# Patient Record
Sex: Male | Born: 1960
Health system: Southern US, Community
[De-identification: ages and names within clinical notes are randomized; demographics above are authoritative.]

## PROBLEM LIST (undated history)

## (undated) DIAGNOSIS — I10 Essential (primary) hypertension: Secondary | ICD-10-CM

## (undated) DIAGNOSIS — R0789 Other chest pain: Secondary | ICD-10-CM

## (undated) DIAGNOSIS — E785 Hyperlipidemia, unspecified: Secondary | ICD-10-CM

## (undated) HISTORY — DX: Hyperlipidemia, unspecified: E78.5

## (undated) HISTORY — DX: Other chest pain: R07.89

## (undated) HISTORY — DX: Essential (primary) hypertension: I10

---

## 2003-02-11 ENCOUNTER — Encounter: Payer: Self-pay | Admitting: Emergency Medicine

## 2003-02-11 ENCOUNTER — Emergency Department (HOSPITAL_COMMUNITY): Admission: EM | Admit: 2003-02-11 | Discharge: 2003-02-11 | Payer: Self-pay | Admitting: Emergency Medicine

## 2011-03-06 ENCOUNTER — Other Ambulatory Visit: Payer: Self-pay | Admitting: Family Medicine

## 2011-03-06 DIAGNOSIS — M542 Cervicalgia: Secondary | ICD-10-CM

## 2011-03-08 ENCOUNTER — Other Ambulatory Visit: Payer: Self-pay

## 2011-03-09 ENCOUNTER — Ambulatory Visit (HOSPITAL_COMMUNITY)
Admission: RE | Admit: 2011-03-09 | Discharge: 2011-03-09 | Disposition: A | Discharge status: Home / Self Care | Payer: 59 | Source: Ambulatory Visit | Attending: Family Medicine | Admitting: Family Medicine

## 2011-03-09 DIAGNOSIS — M5030 Other cervical disc degeneration, unspecified cervical region: Secondary | ICD-10-CM | POA: Insufficient documentation

## 2011-03-09 DIAGNOSIS — M47812 Spondylosis without myelopathy or radiculopathy, cervical region: Secondary | ICD-10-CM | POA: Insufficient documentation

## 2011-03-09 DIAGNOSIS — M542 Cervicalgia: Secondary | ICD-10-CM

## 2011-03-09 DIAGNOSIS — M25519 Pain in unspecified shoulder: Secondary | ICD-10-CM | POA: Insufficient documentation

## 2011-03-09 DIAGNOSIS — E041 Nontoxic single thyroid nodule: Secondary | ICD-10-CM | POA: Insufficient documentation

## 2011-03-09 DIAGNOSIS — M79609 Pain in unspecified limb: Secondary | ICD-10-CM | POA: Insufficient documentation

## 2011-03-14 ENCOUNTER — Other Ambulatory Visit: Payer: Self-pay | Admitting: Family Medicine

## 2011-03-14 DIAGNOSIS — E041 Nontoxic single thyroid nodule: Secondary | ICD-10-CM

## 2011-03-16 ENCOUNTER — Ambulatory Visit
Admission: RE | Admit: 2011-03-16 | Discharge: 2011-03-16 | Disposition: A | Discharge status: Home / Self Care | Payer: 59 | Source: Ambulatory Visit | Attending: Family Medicine | Admitting: Family Medicine

## 2011-03-16 DIAGNOSIS — E041 Nontoxic single thyroid nodule: Secondary | ICD-10-CM

## 2011-03-17 ENCOUNTER — Other Ambulatory Visit: Payer: Self-pay | Admitting: Family Medicine

## 2011-03-17 DIAGNOSIS — E042 Nontoxic multinodular goiter: Secondary | ICD-10-CM

## 2011-03-23 ENCOUNTER — Ambulatory Visit
Admission: RE | Admit: 2011-03-23 | Discharge: 2011-03-23 | Disposition: A | Discharge status: Home / Self Care | Payer: 59 | Source: Ambulatory Visit | Attending: Family Medicine | Admitting: Family Medicine

## 2011-03-23 ENCOUNTER — Other Ambulatory Visit (HOSPITAL_COMMUNITY)
Admission: RE | Admit: 2011-03-23 | Discharge: 2011-03-23 | Disposition: A | Discharge status: Home / Self Care | Payer: 59 | Source: Ambulatory Visit | Attending: Interventional Radiology | Admitting: Interventional Radiology

## 2011-03-23 DIAGNOSIS — E049 Nontoxic goiter, unspecified: Secondary | ICD-10-CM | POA: Insufficient documentation

## 2011-03-23 DIAGNOSIS — E042 Nontoxic multinodular goiter: Secondary | ICD-10-CM

## 2013-01-07 ENCOUNTER — Ambulatory Visit
Admission: RE | Admit: 2013-01-07 | Discharge: 2013-01-07 | Disposition: A | Discharge status: Home / Self Care | Payer: 59 | Source: Ambulatory Visit | Attending: Family Medicine | Admitting: Family Medicine

## 2013-01-07 ENCOUNTER — Other Ambulatory Visit: Payer: Self-pay | Admitting: Family Medicine

## 2013-01-07 DIAGNOSIS — R1031 Right lower quadrant pain: Secondary | ICD-10-CM

## 2013-01-07 MED ORDER — IOHEXOL 300 MG/ML  SOLN
125.0000 mL | Freq: Once | INTRAMUSCULAR | Status: AC | PRN
  Administered 2013-01-07: 125 mL via INTRAVENOUS

## 2013-03-04 ENCOUNTER — Other Ambulatory Visit: Payer: Self-pay | Admitting: Gastroenterology

## 2013-08-03 ENCOUNTER — Encounter: Payer: Self-pay | Admitting: *Deleted

## 2013-08-03 DIAGNOSIS — E7800 Pure hypercholesterolemia, unspecified: Secondary | ICD-10-CM | POA: Insufficient documentation

## 2013-08-03 DIAGNOSIS — I10 Essential (primary) hypertension: Secondary | ICD-10-CM

## 2014-01-23 ENCOUNTER — Other Ambulatory Visit: Payer: Self-pay | Admitting: Orthopaedic Surgery

## 2014-01-23 DIAGNOSIS — M25561 Pain in right knee: Secondary | ICD-10-CM

## 2014-01-30 ENCOUNTER — Ambulatory Visit
Admission: RE | Admit: 2014-01-30 | Discharge: 2014-01-30 | Disposition: A | Discharge status: Home / Self Care | Payer: 59 | Source: Ambulatory Visit | Attending: Orthopaedic Surgery | Admitting: Orthopaedic Surgery

## 2014-01-30 DIAGNOSIS — M25561 Pain in right knee: Secondary | ICD-10-CM

## 2016-06-22 DIAGNOSIS — E7800 Pure hypercholesterolemia, unspecified: Secondary | ICD-10-CM | POA: Diagnosis not present

## 2016-10-18 DIAGNOSIS — H401131 Primary open-angle glaucoma, bilateral, mild stage: Secondary | ICD-10-CM | POA: Diagnosis not present

## 2017-01-02 DIAGNOSIS — Z0000 Encounter for general adult medical examination without abnormal findings: Secondary | ICD-10-CM | POA: Diagnosis not present

## 2017-01-02 DIAGNOSIS — I10 Essential (primary) hypertension: Secondary | ICD-10-CM | POA: Diagnosis not present

## 2017-01-02 DIAGNOSIS — Z1159 Encounter for screening for other viral diseases: Secondary | ICD-10-CM | POA: Diagnosis not present

## 2017-01-02 DIAGNOSIS — E7800 Pure hypercholesterolemia, unspecified: Secondary | ICD-10-CM | POA: Diagnosis not present

## 2017-07-10 DIAGNOSIS — H401131 Primary open-angle glaucoma, bilateral, mild stage: Secondary | ICD-10-CM | POA: Diagnosis not present

## 2017-07-18 DIAGNOSIS — H401131 Primary open-angle glaucoma, bilateral, mild stage: Secondary | ICD-10-CM | POA: Diagnosis not present

## 2018-01-08 DIAGNOSIS — E7800 Pure hypercholesterolemia, unspecified: Secondary | ICD-10-CM | POA: Diagnosis not present

## 2018-01-08 DIAGNOSIS — R1310 Dysphagia, unspecified: Secondary | ICD-10-CM | POA: Diagnosis not present

## 2018-01-08 DIAGNOSIS — Z0000 Encounter for general adult medical examination without abnormal findings: Secondary | ICD-10-CM | POA: Diagnosis not present

## 2018-01-08 DIAGNOSIS — I10 Essential (primary) hypertension: Secondary | ICD-10-CM | POA: Diagnosis not present

## 2018-01-10 ENCOUNTER — Other Ambulatory Visit: Payer: Self-pay | Admitting: Family Medicine

## 2018-01-10 DIAGNOSIS — R1310 Dysphagia, unspecified: Secondary | ICD-10-CM

## 2018-01-17 ENCOUNTER — Ambulatory Visit
Admission: RE | Admit: 2018-01-17 | Discharge: 2018-01-17 | Disposition: A | Discharge status: Home / Self Care | Payer: 59 | Source: Ambulatory Visit | Attending: Family Medicine | Admitting: Family Medicine

## 2018-01-17 DIAGNOSIS — R1310 Dysphagia, unspecified: Secondary | ICD-10-CM | POA: Diagnosis not present

## 2018-01-17 DIAGNOSIS — K449 Diaphragmatic hernia without obstruction or gangrene: Secondary | ICD-10-CM | POA: Diagnosis not present

## 2018-01-24 DIAGNOSIS — R6882 Decreased libido: Secondary | ICD-10-CM | POA: Diagnosis not present

## 2018-01-31 ENCOUNTER — Other Ambulatory Visit: Payer: Self-pay | Admitting: Family Medicine

## 2018-01-31 DIAGNOSIS — R1310 Dysphagia, unspecified: Secondary | ICD-10-CM

## 2018-02-12 ENCOUNTER — Ambulatory Visit
Admission: RE | Admit: 2018-02-12 | Discharge: 2018-02-12 | Disposition: A | Discharge status: Home / Self Care | Payer: 59 | Source: Ambulatory Visit | Attending: Family Medicine | Admitting: Family Medicine

## 2018-02-12 DIAGNOSIS — R1310 Dysphagia, unspecified: Secondary | ICD-10-CM

## 2018-02-12 DIAGNOSIS — E042 Nontoxic multinodular goiter: Secondary | ICD-10-CM | POA: Diagnosis not present

## 2018-02-15 ENCOUNTER — Other Ambulatory Visit: Payer: Self-pay | Admitting: Family Medicine

## 2018-02-15 DIAGNOSIS — E041 Nontoxic single thyroid nodule: Secondary | ICD-10-CM

## 2018-03-05 ENCOUNTER — Other Ambulatory Visit (HOSPITAL_COMMUNITY)
Admission: RE | Admit: 2018-03-05 | Discharge: 2018-03-05 | Disposition: A | Discharge status: Home / Self Care | Payer: 59 | Source: Ambulatory Visit | Attending: Radiology | Admitting: Radiology

## 2018-03-05 ENCOUNTER — Ambulatory Visit
Admission: RE | Admit: 2018-03-05 | Discharge: 2018-03-05 | Disposition: A | Discharge status: Home / Self Care | Payer: 59 | Source: Ambulatory Visit | Attending: Family Medicine | Admitting: Family Medicine

## 2018-03-05 DIAGNOSIS — E041 Nontoxic single thyroid nodule: Secondary | ICD-10-CM | POA: Insufficient documentation

## 2018-03-18 DIAGNOSIS — H401131 Primary open-angle glaucoma, bilateral, mild stage: Secondary | ICD-10-CM | POA: Diagnosis not present

## 2018-03-21 DIAGNOSIS — K640 First degree hemorrhoids: Secondary | ICD-10-CM | POA: Diagnosis not present

## 2018-03-21 DIAGNOSIS — Z86010 Personal history of colonic polyps: Secondary | ICD-10-CM | POA: Diagnosis not present

## 2018-06-05 ENCOUNTER — Encounter (INDEPENDENT_AMBULATORY_CARE_PROVIDER_SITE_OTHER): Payer: Self-pay | Admitting: Orthopaedic Surgery

## 2018-06-05 ENCOUNTER — Ambulatory Visit (INDEPENDENT_AMBULATORY_CARE_PROVIDER_SITE_OTHER): Payer: Self-pay

## 2018-06-05 ENCOUNTER — Ambulatory Visit (INDEPENDENT_AMBULATORY_CARE_PROVIDER_SITE_OTHER): Payer: 59 | Admitting: Orthopaedic Surgery

## 2018-06-05 DIAGNOSIS — M79641 Pain in right hand: Secondary | ICD-10-CM

## 2018-06-05 MED ORDER — METHYLPREDNISOLONE 4 MG PO TABS: ORAL_TABLET | ORAL | 0 refills | Status: AC

## 2018-06-05 NOTE — Progress Notes (Signed)
Office Visit Note   Patient: Daniel Melton           Date of Birth: [birth year]           MRN: [id number] Visit Date: 06/05/2018              Requested by: Elias Else, MD [phone] [address] [place], [place] [address] PCP: Elias Else, MD   Assessment & Plan: Visit Diagnoses:  1. Pain in right hand     Plan: He seems to certainly have some type of significant inflammatory process ongoing involving his right wrist and hand.  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  I want to put him in a Velcro splint to wear just at nighttime and also try a 6-day steroid taper.  I would like to see him back in just 1 week for repeat exam.  All questions and concerns were answered and addressed.  Follow-Up Instructions: Return in about 1 week (around 06/12/2018).   Orders:  Orders Placed This Encounter  Procedures  . XR Hand Complete Right   Meds ordered this encounter  Medications  . methylPREDNISolone (MEDROL) 4 MG tablet    Sig: Medrol dose pack. Take as instructed    Dispense:  21 tablet    Refill:  0      Procedures: No procedures performed   Clinical Data: No additional findings.   Subjective: Chief Complaint  Patient presents with  . Right Hand - Pain  The patient is a 59 year old that we have seen before.  He comes in though with acute right dominant hand pain and swelling and wrist pain.  He reports that a few weeks ago he did pick a lot of corn for deer feed.  He gets a lot of swelling first thing in the morning and it does wake him up at night.  He denies any numbness and tingling and is never injured this right hand before.  There is no history of gout.  He is not a diabetic.  He says during the day it is okay but he still has some pain around the wrist and hand.  He denies any open wounds or other issues.  He denies any joint pain in any other joints in his body.  HPI  Review of Systems He currently denies any  headache, chest pain, shortness of breath, fever, chills, nausea, vomiting.  Objective: Vital Signs: There were no vitals taken for this visit.  Physical Exam Is alert or x3 and in no acute distress Ortho Exam Examination of his right hand does show some global swelling comparing the right and left hands.  He is got good grip and pinch strength.  His hand is well-perfused with normal pulses.  He has full range of motion of his wrist fingers and hand which is some mild pain over the flexor tendons of the wrist. Specialty Comments:  No specialty comments available.  Imaging: Xr Hand Complete Right  Result Date: 06/05/2018 3 views of the right hand show no acute findings.    PMFS History: Patient Active Problem List   Diagnosis Date Noted  . Hypercholesteremia 08/03/2013  . Essential hypertension 08/03/2013   Past Medical History:  Diagnosis Date  . Chest wall discomfort   . Hyperlipidemia   . Hypertension     Family History  Problem Relation Age of Onset  . Cancer Mother   . High Cholesterol Father   .  Heart attack Paternal Grandfather     History reviewed. No pertinent surgical history. Social History   Occupational History  . Not on file  Tobacco Use  . Smoking status: Never Smoker  Substance and Sexual Activity  . Alcohol use: No  . Drug use: No  . Sexual activity: Not on file

## 2018-06-10 ENCOUNTER — Ambulatory Visit (INDEPENDENT_AMBULATORY_CARE_PROVIDER_SITE_OTHER): Payer: Self-pay | Admitting: Physician Assistant

## 2018-06-12 ENCOUNTER — Ambulatory Visit (INDEPENDENT_AMBULATORY_CARE_PROVIDER_SITE_OTHER): Payer: 59 | Admitting: Orthopaedic Surgery

## 2018-06-12 ENCOUNTER — Encounter (INDEPENDENT_AMBULATORY_CARE_PROVIDER_SITE_OTHER): Payer: Self-pay | Admitting: Orthopaedic Surgery

## 2018-06-12 DIAGNOSIS — M79641 Pain in right hand: Secondary | ICD-10-CM | POA: Diagnosis not present

## 2018-06-12 MED ORDER — DICLOFENAC SODIUM 1 % TD GEL: 2.0000 g | Freq: Four times a day (QID) | TRANSDERMAL | 1 refills | Status: AC

## 2018-06-12 NOTE — Progress Notes (Signed)
   Office Visit Note   Patient: Daniel Melton           Date of Birth: 08-29-1960           MRN: [id number] Visit Date: 06/12/2018              Requested by: Elias Else, MD [phone] Daniel [address] [place], [place] [address] PCP: Elias Else, MD   Assessment & Plan: Visit Diagnoses:  1. Pain in right hand     Plan: He is given a prescription for Voltaren gel which he can use over the joints.  Again this is felt due to overuse injury consistent with inflammatory process likely extensor tendinitis.  He asked about natural anti-inflammatories he can try turmeric or dried cherries.  He will follow-up with [place] on as-needed basis pain persist or becomes worse.  Follow-Up Instructions: Return if symptoms worsen or fail to improve.   Orders:  No orders of the defined types were placed in this encounter.  Meds ordered this encounter  Medications  . diclofenac sodium (VOLTAREN) 1 % GEL    Sig: Apply 2 g topically 4 (four) times daily.    Dispense:  1 Tube    Refill:  1      Procedures: No procedures performed   Clinical Data: No additional findings.   Subjective: Chief Complaint  Patient presents with  . Right Hand - Follow-up    HPI Mr. Daniel Melton returns today for follow-up of his right hand swelling and pain.  He states he is overall better since using night splint and taking the Medrol Dosepak.  Still has some pain mostly involving the long finger and ring finger of the right hand.  He has had no fevers chills.  Again injury to the hand Review of Systems See HPI  Objective: Vital Signs: There were no vitals taken for this visit.  Physical Exam Constitutional:      Appearance: He is not ill-appearing or diaphoretic.  Neurological:     Mental Status: He is alert and oriented to person, place, and time.     Ortho Exam Right hand no rashes skin lesions ulcerations.  Mild edema compared to the left mostly involving the fingers particularly the long and index  finger.  He has full range of motion hand without significant pain.  Hyperextension of the fingers against resistance reveals some discomfort in the long and ring finger of the right hand only.  Radial pulses intact.  Has tenderness over the PIP joints of the right ring and index finger only.  Remainder the hand is nontender. Specialty Comments:  No specialty comments available.  Imaging: No results found.   PMFS History: Patient Active Problem List   Diagnosis Date Noted  . Hypercholesteremia 08/03/2013  . Essential hypertension 08/03/2013   Past Medical History:  Diagnosis Date  . Chest wall discomfort   . Hyperlipidemia   . Hypertension     Family History  Problem Relation Age of Onset  . Cancer Mother   . High Cholesterol Father   . Heart attack Paternal Grandfather     No past surgical history on file. Social History   Occupational History  . Not on file  Tobacco Use  . Smoking status: Never Smoker  Substance and Sexual Activity  . Alcohol use: No  . Drug use: No  . Sexual activity: Not on file

## 2019-05-14 IMAGING — US US THYROID
1 series · 16 of 25 positions shown · non-contrast
Comparison: 03/16/2011

CLINICAL DATA: Dysphagia times 11 months. Previous FNA biopsy of
mid/inferior right and inferior left nodules 03/23/2011.

EXAM:
THYROID ULTRASOUND
TECHNIQUE: Ultrasound examination of the thyroid gland and adjacent soft
tissues was performed.

[Series 1: us thyroid · 0.06mm/px · 16 of 94 slices shown]
[im 1/94]
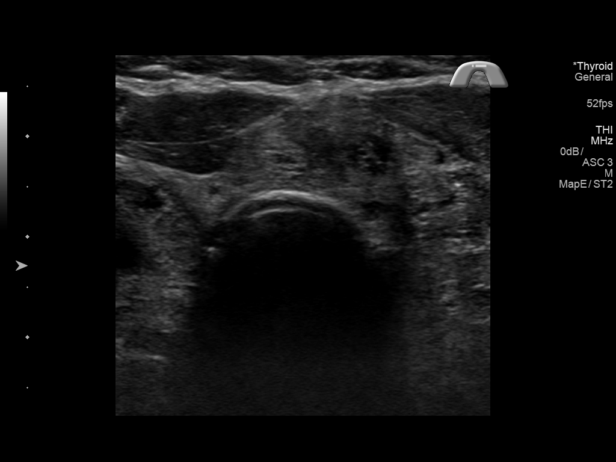
[im 8/94]
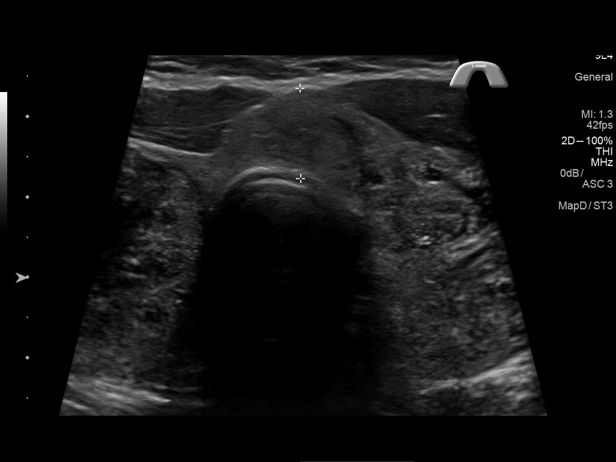
[im 12/94]
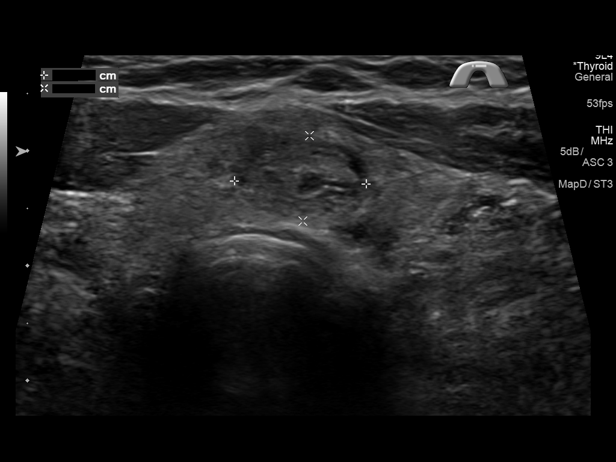
[im 20/94]
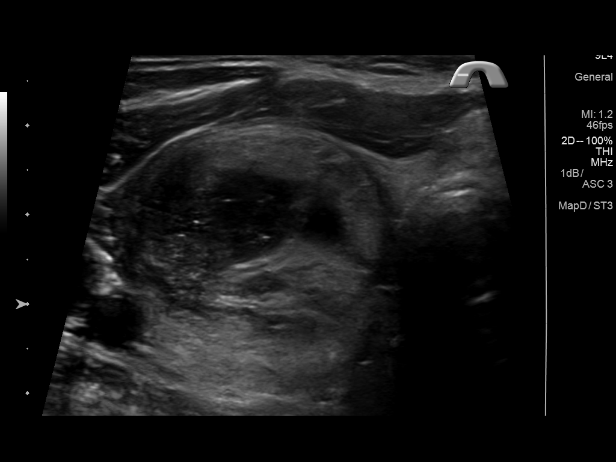
[im 28/94]
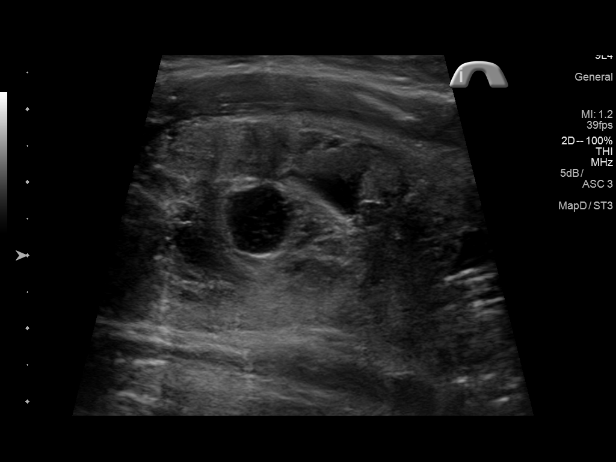
[im 32/94]
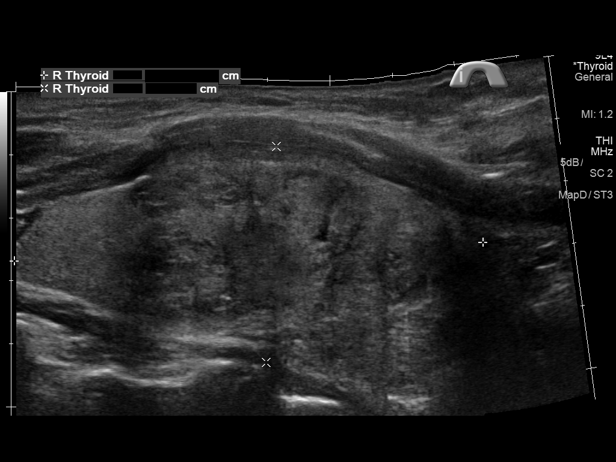
[im 39/94]
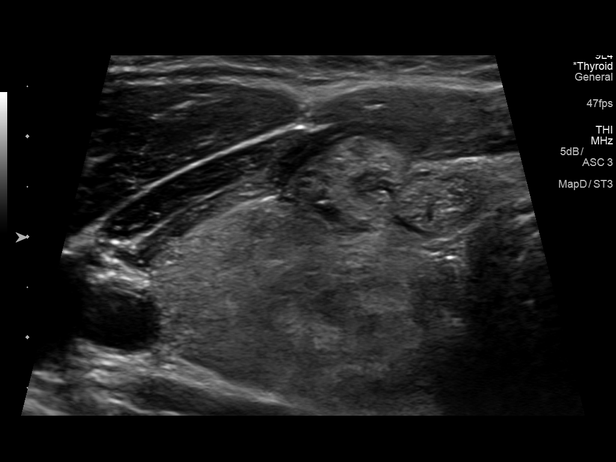
[im 43/94]
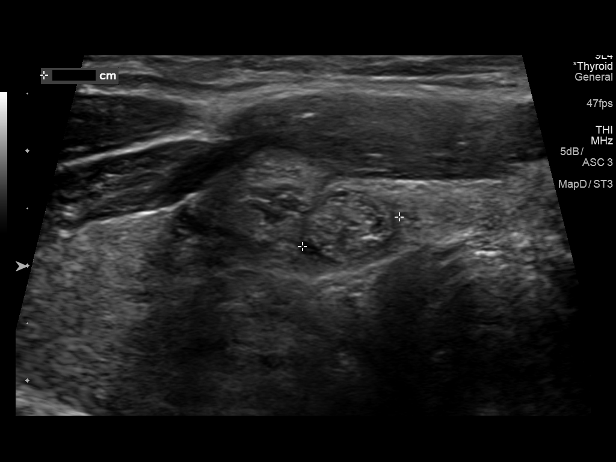
[im 51/94]
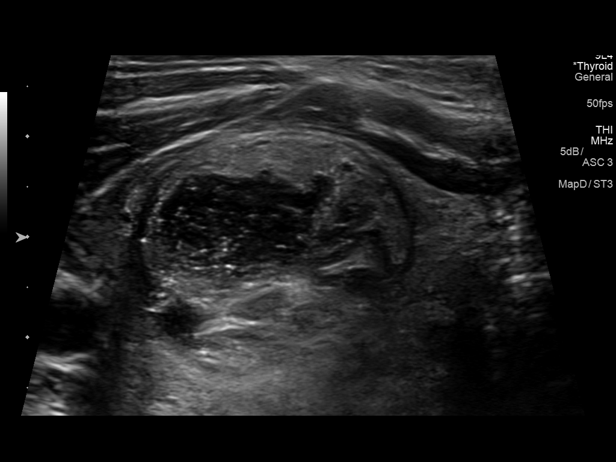
[im 55/94]
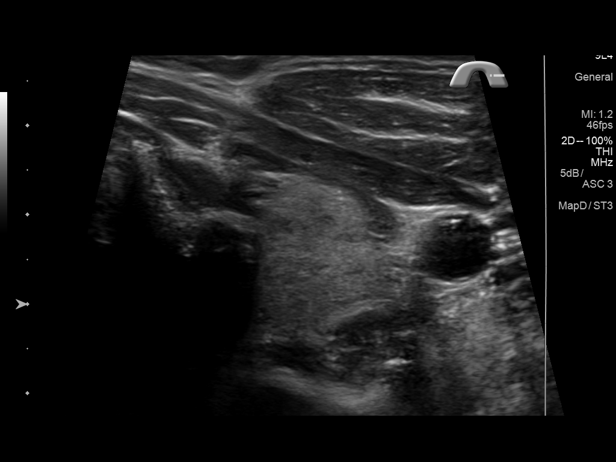
[im 63/94]
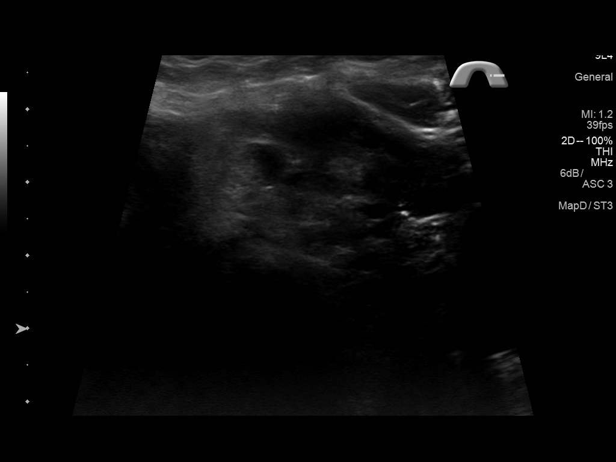
[im 66/94]
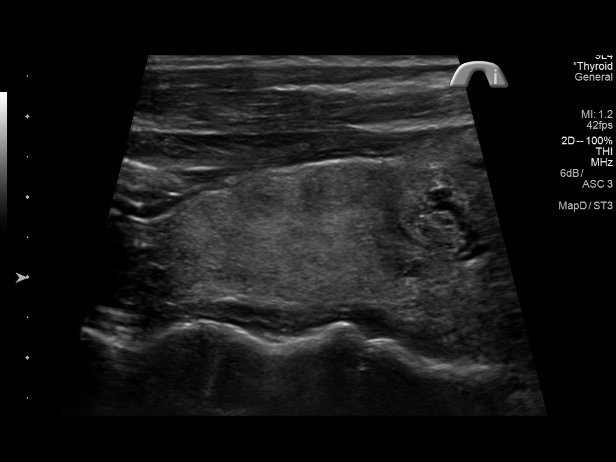
[im 74/94]
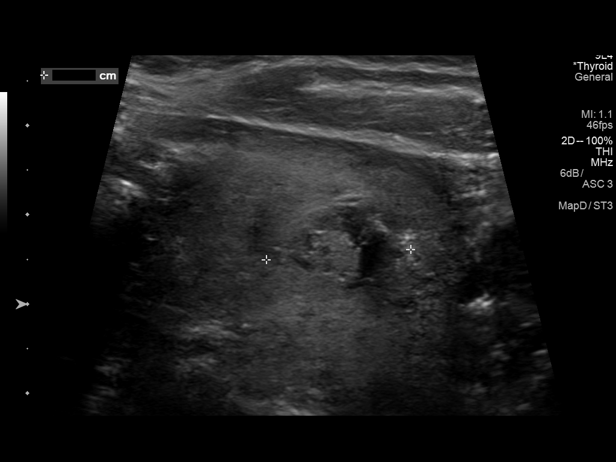
[im 82/94]
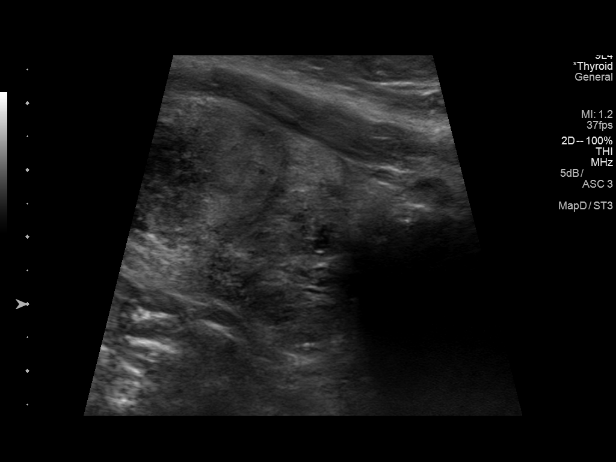
[im 86/94]
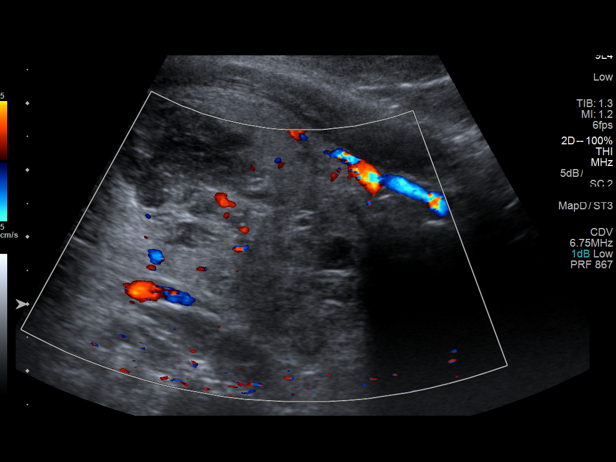
[im 94/94]
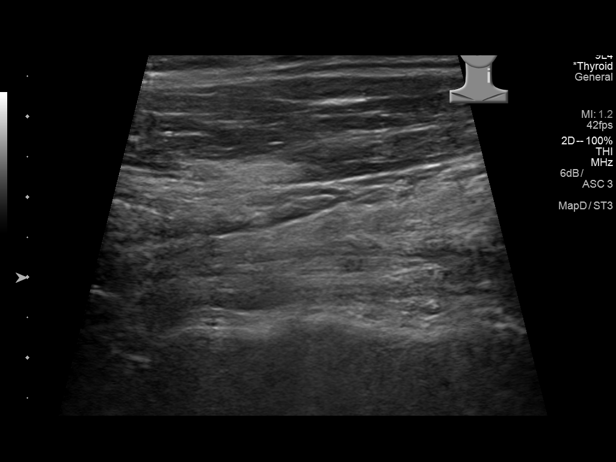

[16 of 25 positions shown; findings below may reference images not displayed]

FINDINGS: Parenchymal Echotexture: Moderately heterogenous

Isthmus: 1.1 cm thickness, previously

Right lobe: 7.5 x 3.4 x 3.1 cm, previously 6.5 x 2.9 x 2

Left lobe: 7.4 x 3.4 x 2.8 cm, previously 7.7 x 3.4 x

_________________________________________________________

Estimated total number of nodules >/= 1 cm: 6-10

Number of spongiform nodules >/=  2 cm not described below (TR1): 0

Number of mixed cystic and solid nodules >/= 1.5 cm not described
below (TR2): 0

_________________________________________________________

Nodule # 1:

Prior biopsy: No

Location: Right; Superior

Maximum size: 1.6 cm; Other 2 dimensions: 1 x 1.4 cm, previously,
0.9 x 0.5 x 0.7 cm

Composition: solid/almost completely solid (2)

Echogenicity: isoechoic (1)

Shape: not taller-than-wide (0)

Margins: smooth (0)

Echogenic foci: none (0)

ACR TI-RADS total points: 3.

ACR TI-RADS risk category:  TR3 (3 points).

Significant change in size (>/= 20% in two dimensions and minimal
increase of 2 mm): No

Change in features: No

Change in ACR TI-RADS risk category: No

ACR TI-RADS recommendations:

*Given size (>/= 1.5 - 2.4 cm) and appearance, a follow-up
ultrasound in 1 year should be considered based on TI-RADS criteria.

_________________________________________________________

Nodule # 2: 0.9 x 0.8 x 0.7 cm solid, medial superior right,
previously 0.9 x 0.8 x 0.5; lack of significant growth for greater
than 5 years implies benignity; This nodule does NOT meet TI-RADS
criteria for biopsy or dedicated follow-up.

Nodule # 3:

Prior biopsy: No

Location: Right; Mid

Maximum size: 3.6 cm; Other 2 dimensions: 2.5 x 2.9 cm, previously,
1.7 x 1.4 x 1.7 cm

Composition: solid/almost completely solid (2)

Echogenicity: hypoechoic (2)

Shape: not taller-than-wide (0)

Margins: smooth (0)

Echogenic foci: punctate echogenic foci (3)

ACR TI-RADS total points: 7.

ACR TI-RADS risk category:  TR5 (>/= 7 points).

Significant change in size (>/= 20% in two dimensions and minimal
increase of 2 mm): Yes

Change in features: Yes

Change in ACR TI-RADS risk category: Yes

ACR TI-RADS recommendations:

**Given size (>/= 1.0 cm) and appearance, fine needle aspiration of
this highly suspicious nodule should be considered based on TI-RADS
criteria.

_________________________________________________________

Nodule # 4: 2.2 x 2 x 2.1 cm inferior right nodule, previously 2.1 x
1.4 x 2; lack of significant growth for greater than 5 years implies
benignity; this was previously biopsied.

_________________________________________________________

Nodule # 5: 1.2 x 0.8 x 1 cm isthmus left of midline, previously
x 0.7 x 1.1; lack of significant interval growth for greater than 5
years implies benignity; This nodule does NOT meet TI-RADS criteria
for biopsy or dedicated follow-up.

Nodule # 6:

Location: Left; Superior

Maximum size: 1.1 cm; Other 2 dimensions: 1.1 x 0.9 cm

Composition: mixed cystic and solid (1)

Echogenicity: isoechoic (1)

Shape: not taller-than-wide (0)

Margins: ill-defined (0)

Echogenic foci: peripheral calcifications (2)

ACR TI-RADS total points: 4.

ACR TI-RADS risk category: TR4 (4-6 points).

ACR TI-RADS recommendations:

*Given size (>/= 1 - 1.4 cm) and appearance, a follow-up ultrasound
in 1 year should be considered based on TI-RADS criteria.

_________________________________________________________

Nodule # 7:

Location: Left; Mid

Maximum size: 1.7 cm; Other 2 dimensions: 1.6 x 1.4 cm

Composition: solid/almost completely solid (2)

Echogenicity: isoechoic (1)

Shape: not taller-than-wide (0)

Margins: ill-defined (0)

Echogenic foci: none (0)

ACR TI-RADS total points: 3.

ACR TI-RADS risk category: TR3 (3 points).

ACR TI-RADS recommendations:

*Given size (>/= 1.5 - 2.4 cm) and appearance, a follow-up
ultrasound in 1 year should be considered based on TI-RADS criteria.

_________________________________________________________

Nodule # 8:

Location: Left; Inferior

Maximum size: 2 cm; Other 2 dimensions: 1.4 x 1.4 cm

Composition: solid/almost completely solid (2)

Echogenicity: isoechoic (1)

Shape: not taller-than-wide (0)

Margins: ill-defined (0)

Echogenic foci: none (0)

ACR TI-RADS total points: 3.

ACR TI-RADS risk category: TR3 (3 points).

ACR TI-RADS recommendations:

*Given size (>/= 1.5 - 2.4 cm) and appearance, a follow-up
ultrasound in 1 year should be considered based on TI-RADS criteria.
IMPRESSION: 1. Thyromegaly with bilateral nodules.
2. Recommend FNA biopsy of 3.6 cm highly suspicious mid right
nodule.
3. Recommend annual/biennial ultrasound follow-up of additional
lesions as above, until stability x5 years confirmed.

The above is in keeping with the ACR TI-RADS recommendations - [HOSPITAL] 1614;[DATE].

## 2019-06-04 IMAGING — US US FNA BIOPSY THYROID 1ST LESION
1 series · 13 of 14 positions shown · non-contrast
Comparison: Thyroid ultrasound performed 02/12/2018.

MEDICATIONS:
None

COMPLICATIONS:
None immediate.

INDICATION: Indeterminate right mid thyroid nodule

EXAM:
ULTRASOUND GUIDED FINE NEEDLE ASPIRATION OF INDETERMINATE RIGHT MID
THYROID NODULE
TECHNIQUE: Informed written consent was obtained from the patient after a
discussion of the risks, benefits and alternatives to treatment.
Questions regarding the procedure were encouraged and answered. A
timeout was performed prior to the initiation of the procedure.

[Series 1: us fna biopsy thyroid 1st lesion · 0.07mm/px · 14 acquisitions, 13 frames shown]
[im 1/14]
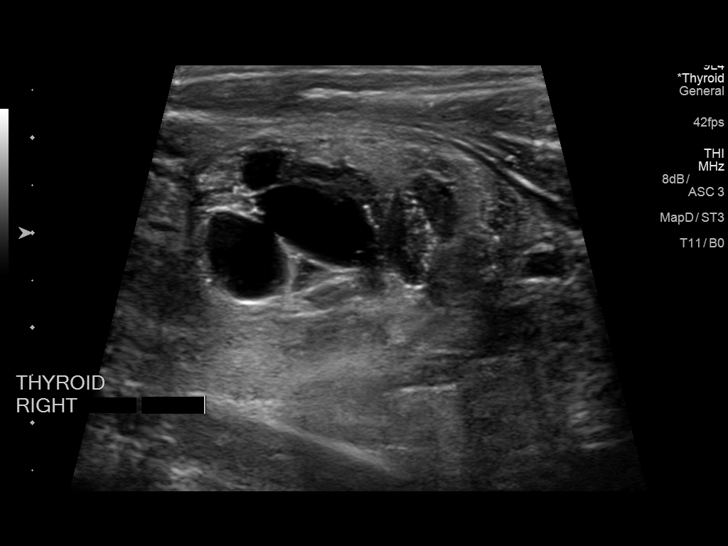
[im 2/14]
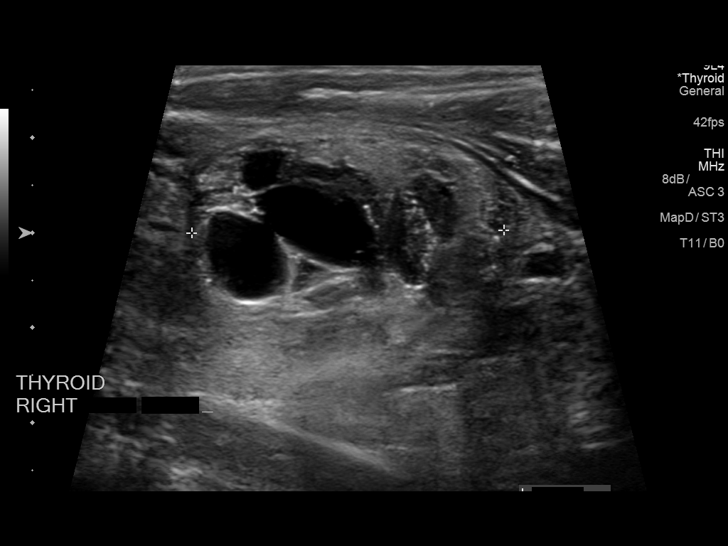
[im 3/14]
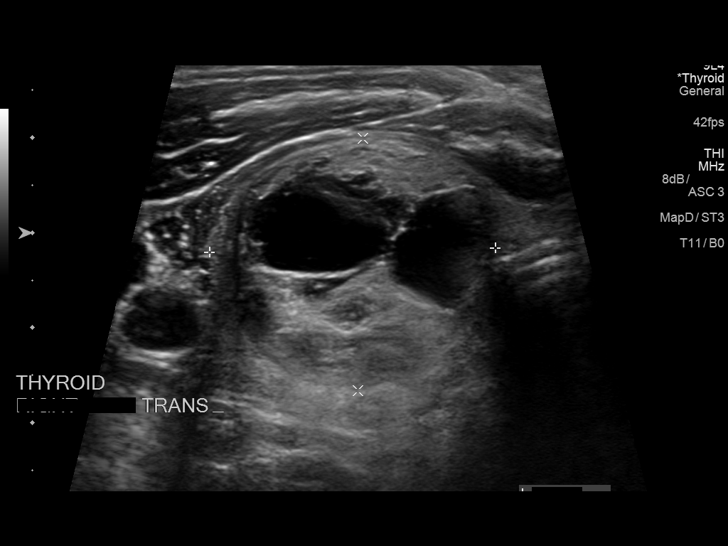
[im 4/14]
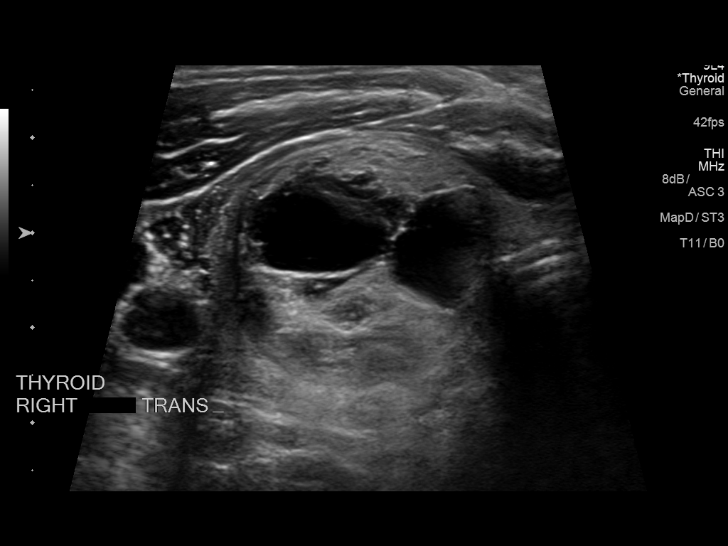
[im 5/14]
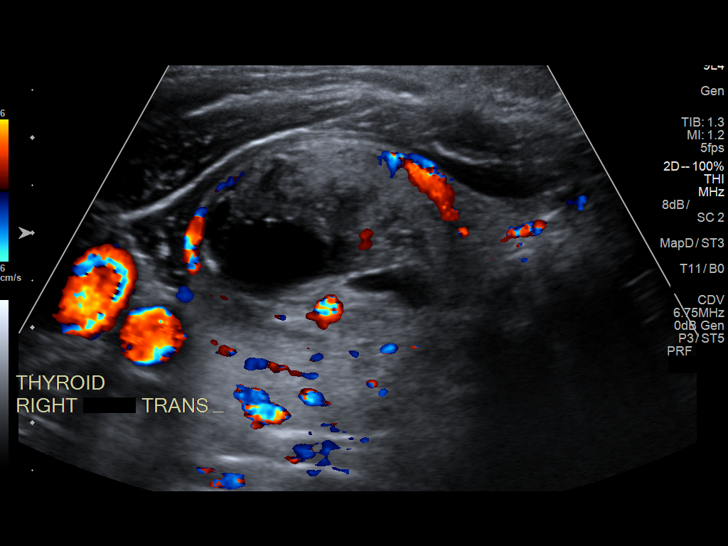
[im 6/14]
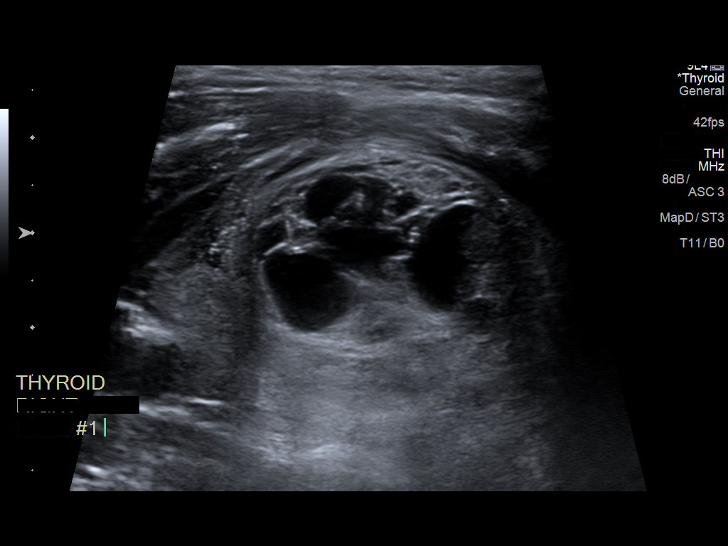
[im 8/14]
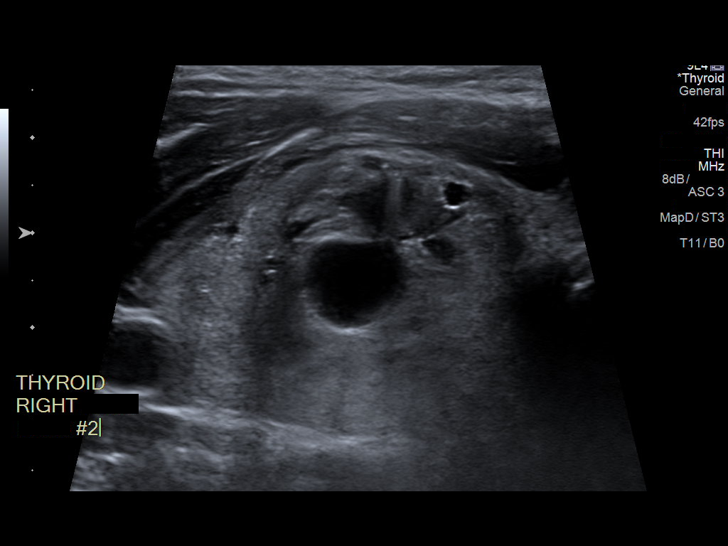
[im 9/14]
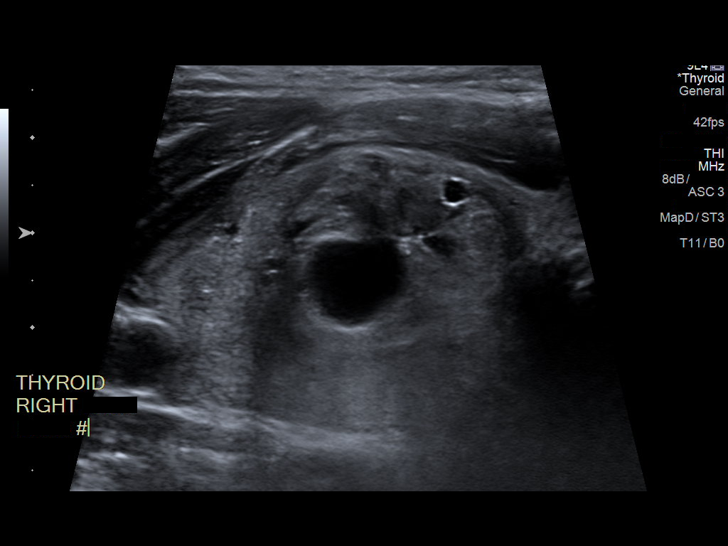
[im 10/14]
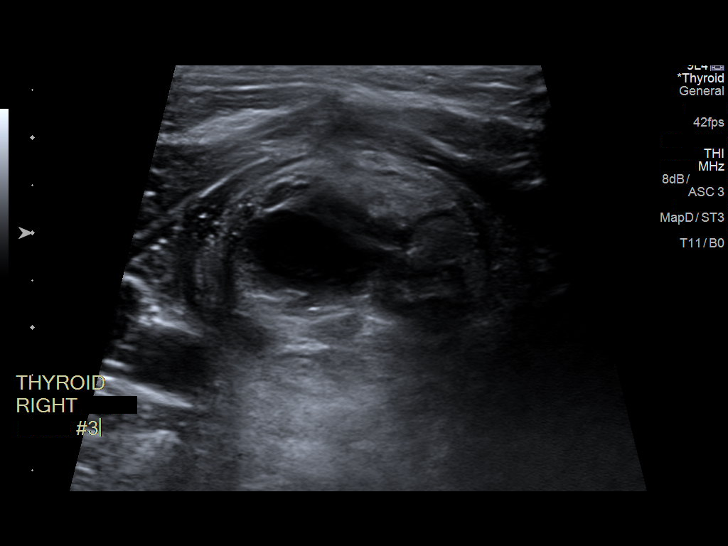
[im 11/14]
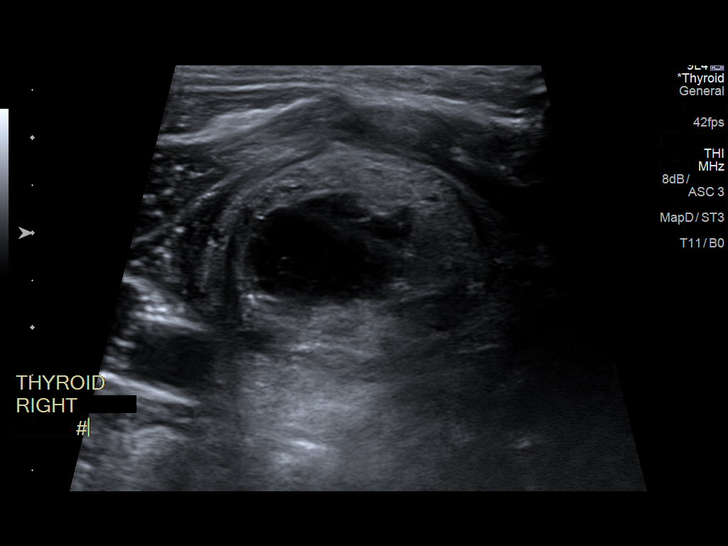
[im 12/14]
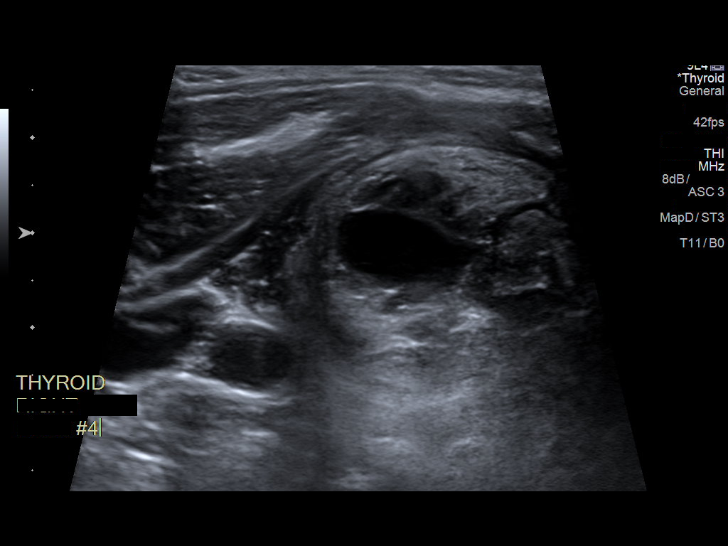
[im 13/14]
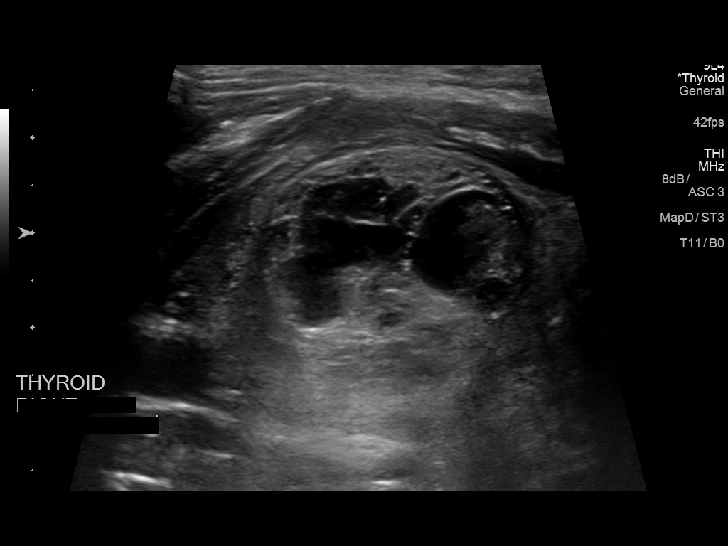
[im 14/14]
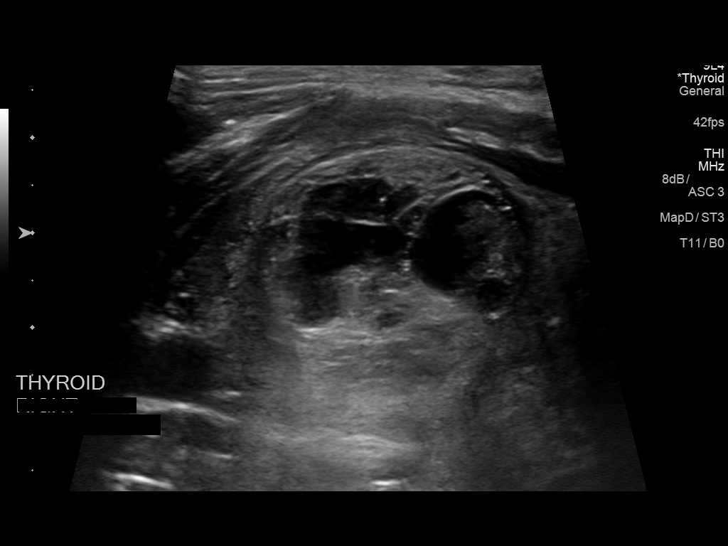

[13 of 14 positions shown; findings below may reference images not displayed]

Pre-procedural ultrasound scanning demonstrated unchanged size and
appearance of the indeterminate nodule within the right mid thyroid.

The procedure was planned. The neck was prepped in the usual sterile
fashion, and a sterile drape was applied covering the operative
field. A timeout was performed prior to the initiation of the
procedure. Local anesthesia was provided with 1% lidocaine.

Under direct ultrasound guidance, 4 FNA biopsies were performed of
the right mid thyroid nodule with a 25 gauge needle. Multiple
ultrasound images were saved for procedural documentation purposes.
The samples were prepared and submitted to pathology.

Limited post procedural scanning was negative for hematoma or
additional complication. Dressings were placed. The patient
tolerated the above procedures procedure well without immediate
postprocedural complication.
FINDINGS: FINDINGS
Nodule reference number based on prior diagnostic ultrasound: 3

Maximum size: 3.6 cm

Location: Right  ;  Mid

ACR TI-RADS total points: 7

ACR TI-RADS risk category:  TR5 (>/= 7 points)

Prior biopsy:  No

Reason for biopsy: meets ACR TI-RADS criteria

Ultrasound imaging confirms appropriate placement of the needles
within the thyroid nodule.
IMPRESSION: Technically successful ultrasound guided fine needle aspiration of
right mid thyroid nodule as described above.

## 2019-07-11 ENCOUNTER — Ambulatory Visit: Payer: 59 | Attending: Internal Medicine

## 2019-07-11 DIAGNOSIS — Z23 Encounter for immunization: Secondary | ICD-10-CM | POA: Insufficient documentation

## 2019-07-11 NOTE — Progress Notes (Signed)
   Covid-19 Vaccination Clinic  Name:  Kaeden Mester    MRN: [id number] DOB: 22-Nov-1960  07/11/2019  Mr. Furches was observed post Covid-19 immunization for 15 minutes without incidence. He was provided with Vaccine Information Sheet and instruction to access the V-Safe system.   Mr. Knight was instructed to call 911 with any severe reactions post vaccine: Marland Kitchen Difficulty breathing  . Swelling of your face and throat  . A fast heartbeat  . A bad rash all over your body  . Dizziness and weakness    Immunizations Administered    Name Date Dose VIS Date Route   Pfizer COVID-19 Vaccine 07/11/2019  3:23 PM 0.3 mL 05/02/2019 Intramuscular   Manufacturer: [employer], [employer]   Lot: [id number]   NDC: [address]-5

## 2019-08-05 ENCOUNTER — Ambulatory Visit: Payer: 59 | Attending: Internal Medicine

## 2019-08-05 DIAGNOSIS — Z23 Encounter for immunization: Secondary | ICD-10-CM

## 2019-08-05 NOTE — Progress Notes (Signed)
   Covid-19 Vaccination Clinic  Name:  Joram Venson    MRN: [id number] DOB: 1960-11-01  08/05/2019  Mr. Hellwig was observed post Covid-19 immunization for 15 minutes without incident. He was provided with Vaccine Information Sheet and instruction to access the V-Safe system.   Mr. Stthomas was instructed to call 911 with any severe reactions post vaccine: Marland Kitchen Difficulty breathing  . Swelling of face and throat  . A fast heartbeat  . A bad rash all over body  . Dizziness and weakness   Immunizations Administered    Name Date Dose VIS Date Route   Pfizer COVID-19 Vaccine 08/05/2019  4:01 PM 0.3 mL 05/02/2019 Intramuscular   Manufacturer: [employer], [employer]   Lot: [id number]   NDC: [address]-8

## 2023-03-08 ENCOUNTER — Other Ambulatory Visit (HOSPITAL_BASED_OUTPATIENT_CLINIC_OR_DEPARTMENT_OTHER): Payer: Self-pay | Admitting: Physician Assistant

## 2023-03-08 ENCOUNTER — Other Ambulatory Visit (HOSPITAL_COMMUNITY): Payer: Self-pay | Admitting: Physician Assistant

## 2023-03-08 DIAGNOSIS — E7800 Pure hypercholesterolemia, unspecified: Secondary | ICD-10-CM

## 2023-03-15 ENCOUNTER — Ambulatory Visit (HOSPITAL_COMMUNITY)
Admission: RE | Admit: 2023-03-15 | Discharge: 2023-03-15 | Disposition: A | Discharge status: Home / Self Care | Payer: Self-pay | Source: Ambulatory Visit | Attending: Physician Assistant | Admitting: Physician Assistant

## 2023-03-15 DIAGNOSIS — E7800 Pure hypercholesterolemia, unspecified: Secondary | ICD-10-CM | POA: Insufficient documentation
